# Patient Record
Sex: Female | Born: 1935 | Race: White | Hispanic: No | Marital: Single
Health system: Southern US, Community
[De-identification: ages and names within clinical notes are randomized; demographics above are authoritative.]

---

## 2009-10-08 ENCOUNTER — Emergency Department: Payer: Self-pay | Admitting: Emergency Medicine

## 2010-01-17 ENCOUNTER — Inpatient Hospital Stay: Payer: Self-pay | Admitting: Internal Medicine

## 2011-11-17 ENCOUNTER — Emergency Department: Payer: Self-pay | Admitting: Emergency Medicine

## 2011-11-17 LAB — CBC
HCT: 28.6 % — ABNORMAL LOW (ref 35.0–47.0)
HGB: 9.8 g/dL — ABNORMAL LOW (ref 12.0–16.0)
MCV: 94 fL (ref 80–100)
Platelet: 185 10*3/uL (ref 150–440)
RDW: 13.6 % (ref 11.5–14.5)
WBC: 9.7 10*3/uL (ref 3.6–11.0)

## 2011-11-17 LAB — BASIC METABOLIC PANEL
Calcium, Total: 9.5 mg/dL (ref 8.5–10.1)
Chloride: 101 mmol/L (ref 98–107)
Co2: 28 mmol/L (ref 21–32)
Creatinine: 2.37 mg/dL — ABNORMAL HIGH (ref 0.60–1.30)
EGFR (African American): 21 — ABNORMAL LOW
EGFR (Non-African Amer.): 18 — ABNORMAL LOW
Potassium: 4 mmol/L (ref 3.5–5.1)
Sodium: 137 mmol/L (ref 136–145)

## 2012-02-22 IMAGING — CR RIGHT SCAPULA - 2+ VIEWS
1 series · 2 of 2 positions shown · non-contrast
Comparison: none

REASON FOR EXAM: fell at home landed opn r side and shoulder
COMMENTS:

[Series 1: view not recorded · 0.17mm/px · 2 of 2 slices shown]
[im 1/2]
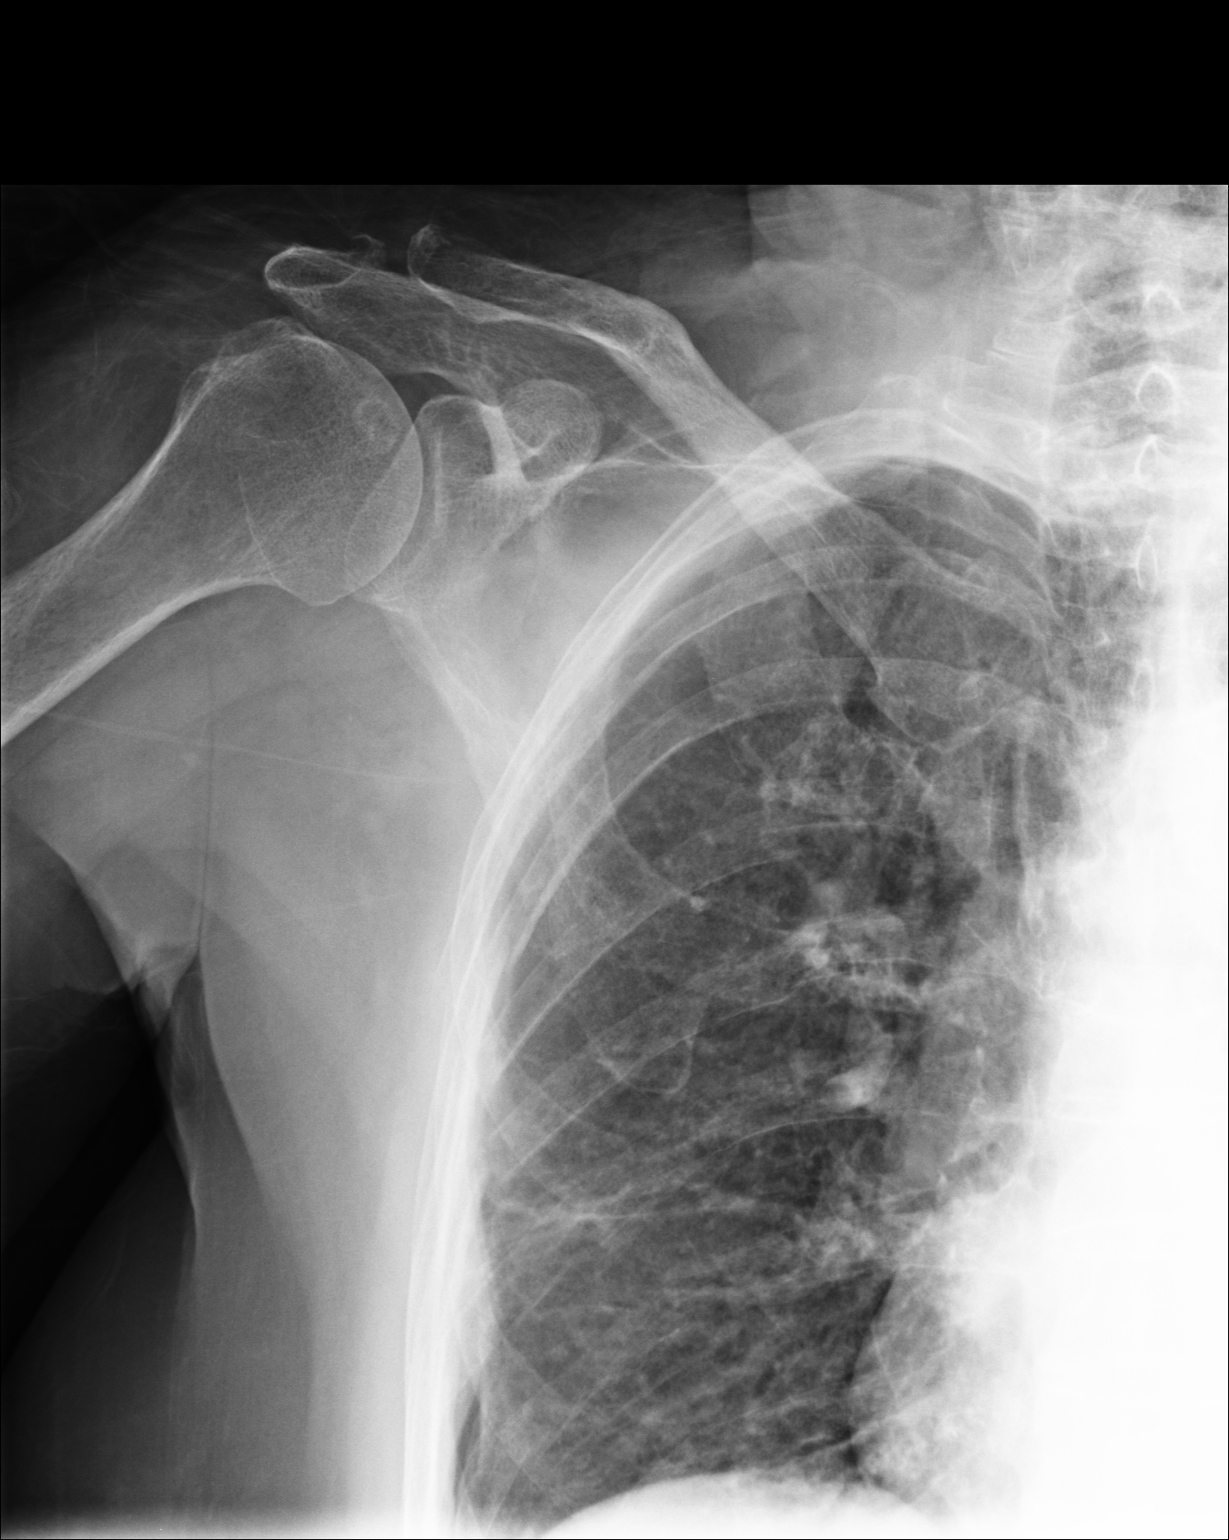
[im 2/2]
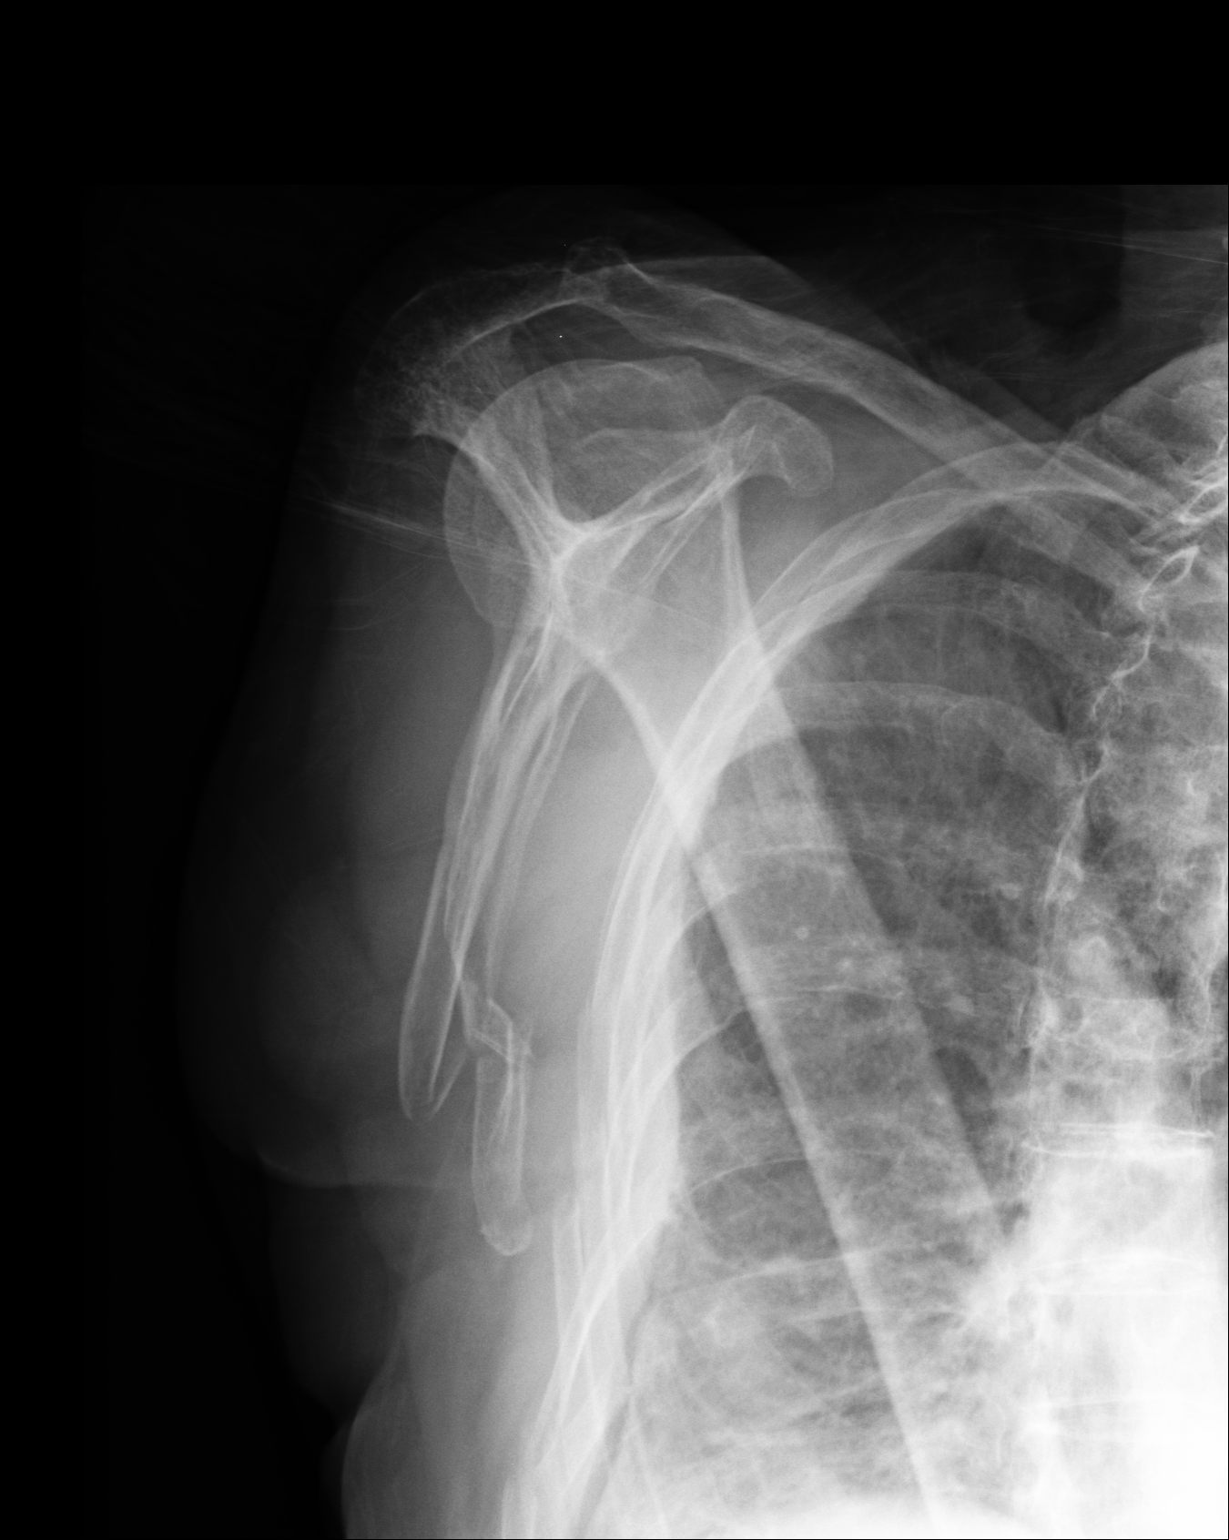

[2 of 2 positions shown; findings below may reference images not displayed]

PROCEDURE:     DXR - DXR SCAPULA RIGHT  - January 17, 2010  [DATE]

RESULT:     No fracture about the shoulder joint is seen. There is a
fracture of the inferior aspect of the scapula. The inferior component
appears mildly displaced. There are multiple right rib fractures. There is a
possible small right pneumothorax. A subpleural hematoma is present on the
right.
IMPRESSION: 1. Fracture of the scapula.
2. There are multiple right rib fracture deformities.
3. Possible small right pneumothorax for which follow-up is recommended.
4. Small subpleural hematoma is observed on the right.

## 2012-02-23 IMAGING — CR DG CHEST 1V PORT
1 series · 1 of 1 positions shown · non-contrast
Comparison: none

REASON FOR EXAM: pneumothorax
COMMENTS:

[view not recorded]
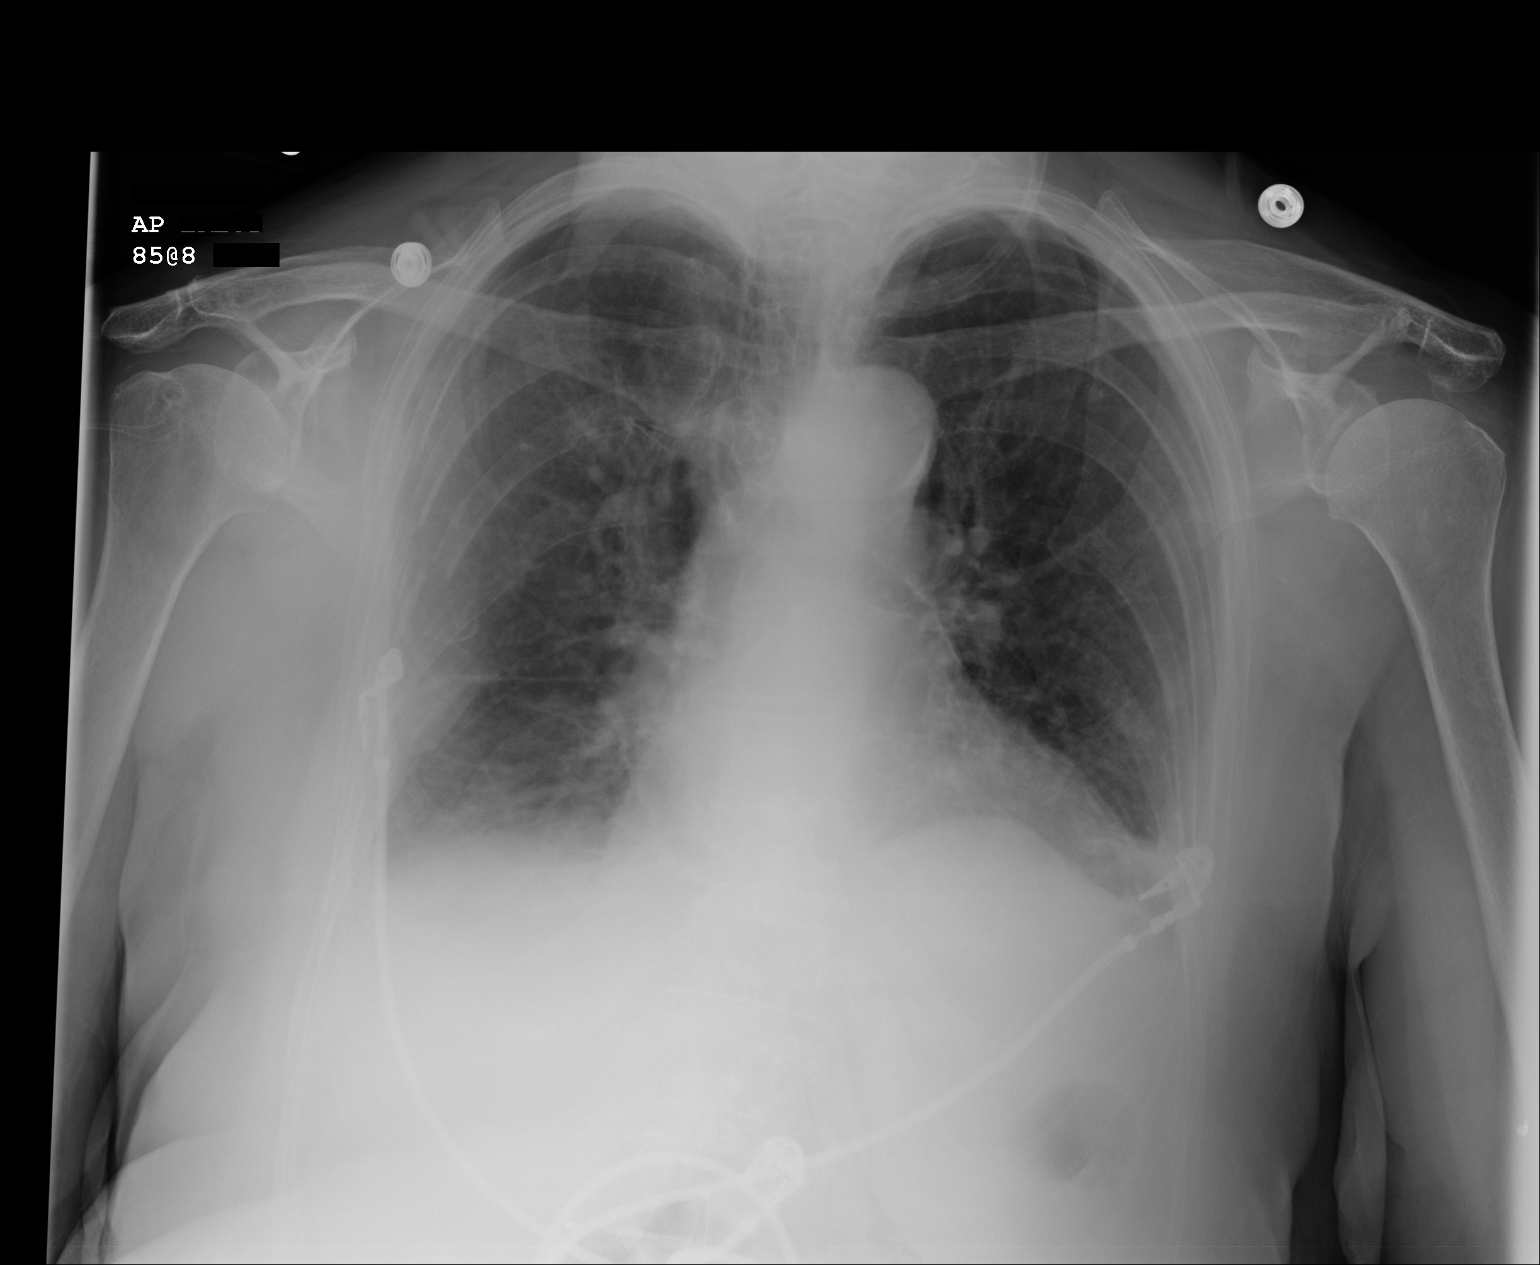

[1 of 1 positions shown; findings below may reference images not displayed]

PROCEDURE:     DXR - DXR PORTABLE CHEST SINGLE VIEW  - January 18, 2010  [DATE]

RESULT:     Comparison is made to prior study dated 01/17/2010.

Multiple rib fractures are once again identified within the right
hemithorax. There is no evidence of an appreciable pneumothorax. The patient
has taken a shallow inspiration. Areas of increased density project within
the lung bases. There is prominence of the interstitial markings. The
cardiac silhouette is moderately enlarged.
IMPRESSION: 1. Areas of mild increased density within the lung bases representing
atelectasis versus early or mild infiltrates. Otherwise, no significant
change in the chest radiograph when compared to the previous study.

## 2012-03-29 ENCOUNTER — Inpatient Hospital Stay: Payer: Self-pay | Admitting: Family Medicine

## 2012-03-29 LAB — COMPREHENSIVE METABOLIC PANEL
Albumin: 3.5 g/dL (ref 3.4–5.0)
Alkaline Phosphatase: 61 U/L (ref 50–136)
BUN: 65 mg/dL — ABNORMAL HIGH (ref 7–18)
Co2: 26 mmol/L (ref 21–32)
Creatinine: 2.06 mg/dL — ABNORMAL HIGH (ref 0.60–1.30)
EGFR (African American): 24 — ABNORMAL LOW
EGFR (Non-African Amer.): 21 — ABNORMAL LOW
Glucose: 103 mg/dL — ABNORMAL HIGH (ref 65–99)
SGOT(AST): 26 U/L (ref 15–37)
SGPT (ALT): 17 U/L (ref 12–78)
Sodium: 138 mmol/L (ref 136–145)
Total Protein: 7.6 g/dL (ref 6.4–8.2)

## 2012-03-29 LAB — TROPONIN I: Troponin-I: <0.02 ng/mL

## 2012-03-29 LAB — URINALYSIS, COMPLETE
Bilirubin,UR: NEGATIVE
Glucose,UR: NEGATIVE mg/dL (ref 0–75)
Nitrite: NEGATIVE
Protein: NEGATIVE
RBC,UR: 4 /HPF (ref 0–5)
WBC UR: 2 /HPF (ref 0–5)

## 2012-03-29 LAB — CK TOTAL AND CKMB (NOT AT ARMC): CK-MB: 1.5 ng/mL (ref 0.5–3.6)

## 2012-03-29 LAB — CBC
MCH: 30.5 pg (ref 26.0–34.0)
MCV: 95 fL (ref 80–100)
Platelet: 179 10*3/uL (ref 150–440)
RDW: 13.9 % (ref 11.5–14.5)

## 2012-03-30 ENCOUNTER — Ambulatory Visit: Payer: Self-pay | Admitting: Internal Medicine

## 2012-03-30 LAB — BASIC METABOLIC PANEL
BUN: 50 mg/dL — ABNORMAL HIGH (ref 7–18)
Calcium, Total: 9 mg/dL (ref 8.5–10.1)
Creatinine: 1.72 mg/dL — ABNORMAL HIGH (ref 0.60–1.30)
EGFR (Non-African Amer.): 26 — ABNORMAL LOW
Glucose: 92 mg/dL (ref 65–99)
Osmolality: 298 (ref 275–301)
Sodium: 143 mmol/L (ref 136–145)

## 2012-03-31 LAB — BASIC METABOLIC PANEL
BUN: 41 mg/dL — ABNORMAL HIGH (ref 7–18)
Chloride: 113 mmol/L — ABNORMAL HIGH (ref 98–107)
Creatinine: 1.66 mg/dL — ABNORMAL HIGH (ref 0.60–1.30)
EGFR (African American): 31 — ABNORMAL LOW
EGFR (Non-African Amer.): 27 — ABNORMAL LOW
Glucose: 111 mg/dL — ABNORMAL HIGH (ref 65–99)
Osmolality: 296 (ref 275–301)
Potassium: 4.2 mmol/L (ref 3.5–5.1)
Sodium: 143 mmol/L (ref 136–145)

## 2012-03-31 LAB — CBC WITH DIFFERENTIAL/PLATELET
Basophil #: 0 10*3/uL (ref 0.0–0.1)
HGB: 9.7 g/dL — ABNORMAL LOW (ref 12.0–16.0)
Lymphocyte #: 1 10*3/uL (ref 1.0–3.6)
MCHC: 33.6 g/dL (ref 32.0–36.0)
MCV: 94 fL (ref 80–100)
Monocyte %: 9.6 %
Neutrophil #: 4.7 10*3/uL (ref 1.4–6.5)
Neutrophil %: 73.5 %
Platelet: 204 10*3/uL (ref 150–440)
RBC: 3.07 10*6/uL — ABNORMAL LOW (ref 3.80–5.20)
WBC: 6.4 10*3/uL (ref 3.6–11.0)

## 2012-03-31 LAB — URINE CULTURE

## 2012-04-04 LAB — CULTURE, BLOOD (SINGLE)

## 2012-04-30 ENCOUNTER — Ambulatory Visit: Payer: Self-pay | Admitting: Internal Medicine

## 2012-07-11 ENCOUNTER — Ambulatory Visit: Payer: Self-pay | Admitting: Family Medicine

## 2012-07-11 LAB — URINALYSIS, COMPLETE
Bilirubin,UR: NEGATIVE
Nitrite: POSITIVE
Ph: 8 (ref 4.5–8.0)
RBC,UR: 5432 /HPF (ref 0–5)
Specific Gravity: 1.013 (ref 1.003–1.030)
Squamous Epithelial: 16
WBC UR: 3499 /HPF (ref 0–5)

## 2012-07-13 LAB — URINE CULTURE

## 2012-09-27 DEATH — deceased

## 2014-05-04 IMAGING — CR DG CHEST 1V PORT
1 series · 2 of 2 positions shown · non-contrast
Comparison: none

REASON FOR EXAM: hypotension
COMMENTS:

PROCEDURE:     DXR - DXR PORTABLE CHEST SINGLE VIEW  - March 29, 2012  [DATE]
RESULT:     Comparison: 11/17/2011, 01/17/2010

[Series 1: ap · 0.17mm/px · 2 of 2 slices shown]
[im 1/2]
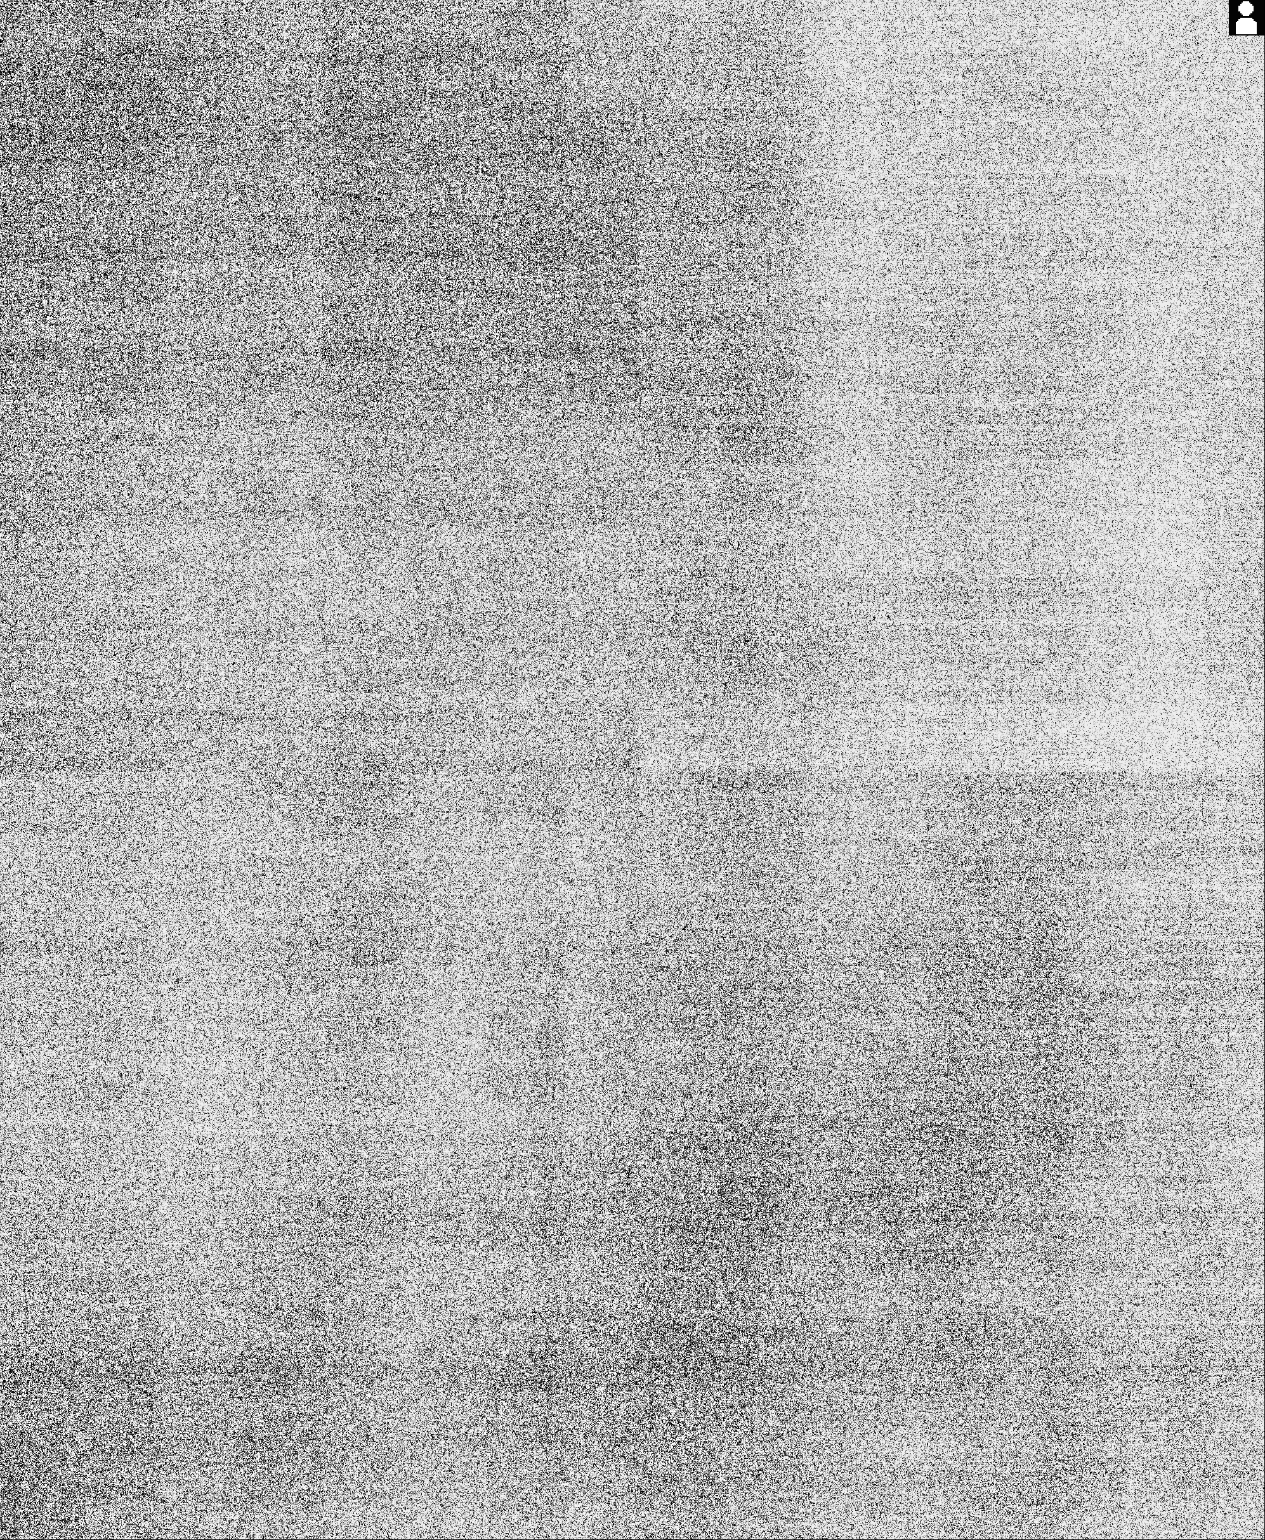
[im 2/2]
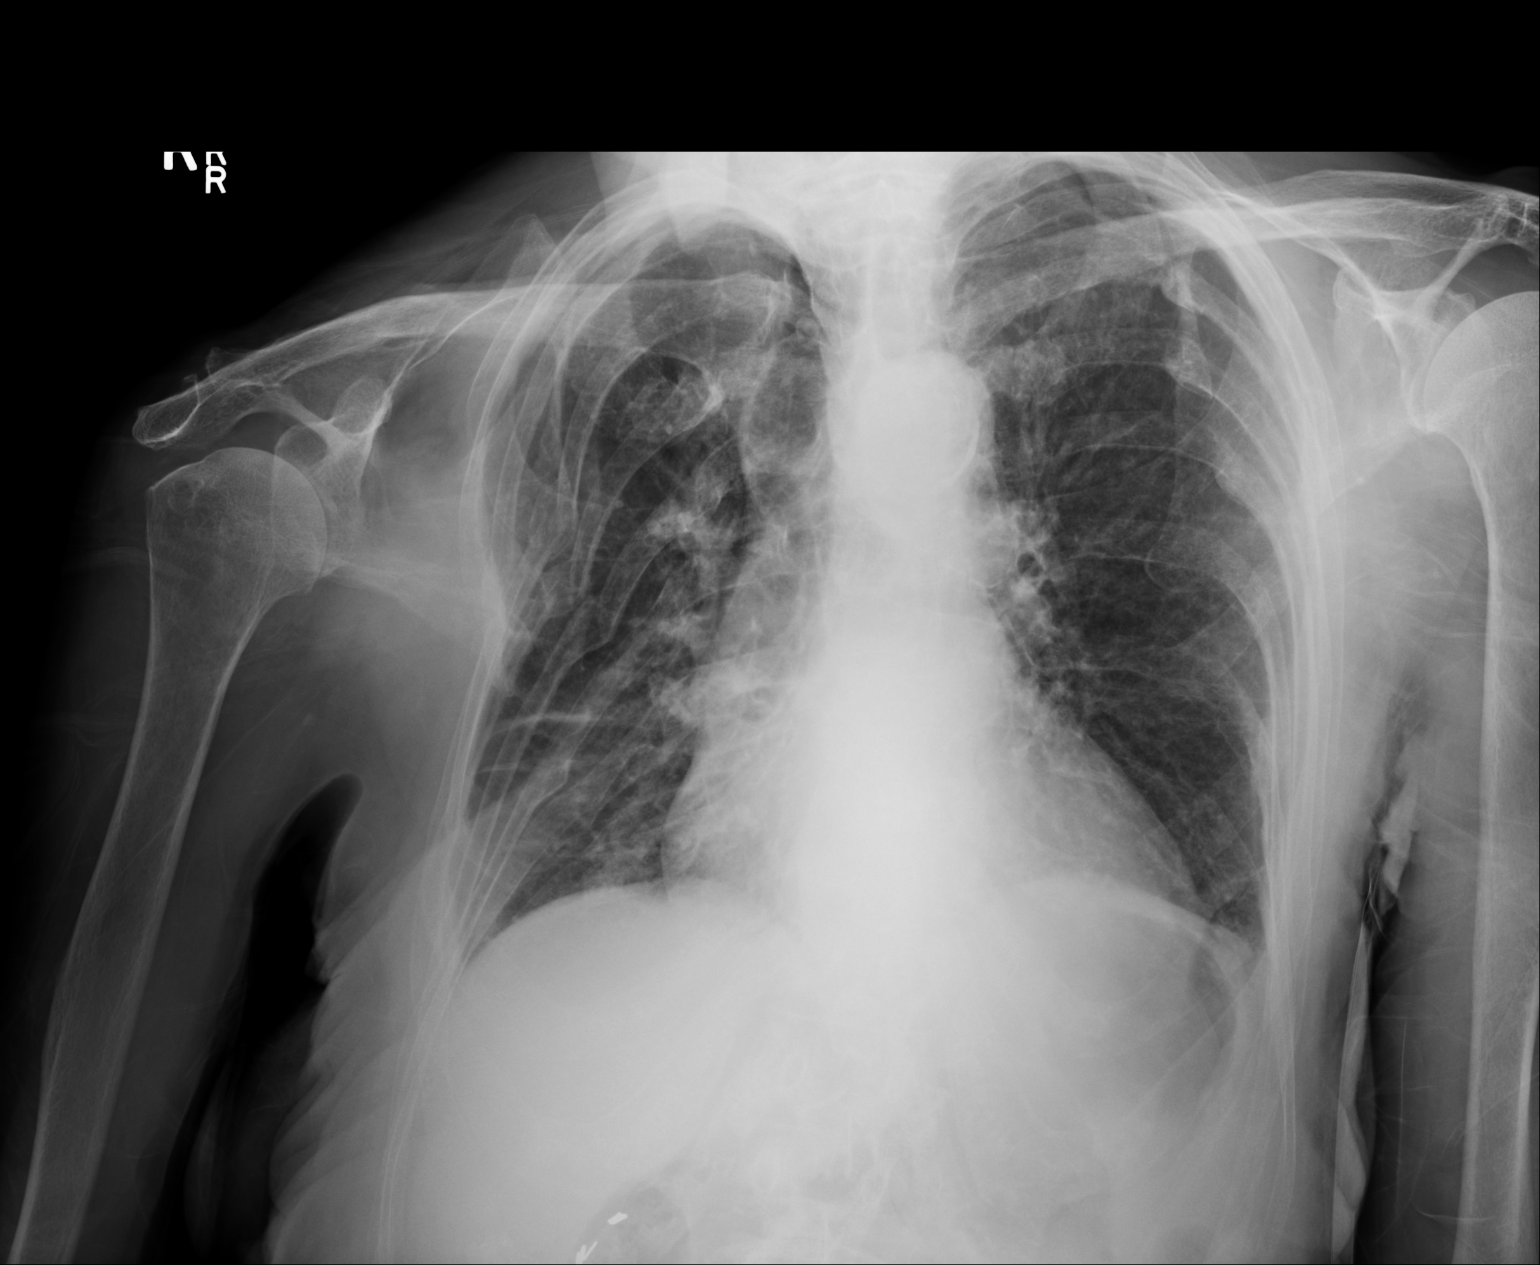

[2 of 2 positions shown; findings below may reference images not displayed]

FINDINGS: The heart and mediastinum are stable. There is marked old posttraumatic
deformity of the right chest wall, similar to prior. There are old left
posterior lateral fractures, as well. Mild right basilar opacities are
decreased from prior and likely due to atelectasis. Small nodular density
adjacent to the right hilum is similar to prior studies and likely related
to a vessel on end.
IMPRESSION: Mild right basilar opacities are decreased from prior and likely secondary
to atelectasis. However, followup PA and lateral chest radiograph is
recommended.

[REDACTED]

## 2014-07-20 NOTE — Consult Note (Signed)
    Comments   Family conference with daughter and son in-law.  Reviewed options as pt meets criteria for Hospice Services secondary to Advanced Dementia, Failure to Thrive.  Reviewed earlier discussion to include comfort care, [facility], another SNF which would accept hospice services or Hospice Home.  Discussed comfort care, rehydration and frequent hospitalizations with advanced dementia.  Daughter verbalizes understanding and wishes at this time comfort care, no re-hospitalizations.  Will proceed with hospice liasion to further discuss financial options for Hospice Home which would be daughter's first choice for discharge for pt. diagnosis: Dementia (fast 7c); FTT (PPS 40%, BMI 21.9, not eating), CKD IV  Electronic Signatures for Addendum Section:  Phifer, Harriett SineNancy (MD) (Signed Addendum 02-Jan-14 14:04)  Discussed with Myrle Wanek, NP, in detail. Agree with assessment and plan as outlined in above note.   Electronic Signatures: Pearletha FurlGusler, Tukker Byrns Z (NP)  (Signed 02-Jan-14 12:49)  Authored: Palliative Care Phifer, Harriett SineNancy (MD)  (Signed 02-Jan-14 20:30)  Authored: Palliative Care   Last Updated: 02-Jan-14 20:30 by Phifer, Harriett SineNancy (MD)

## 2014-07-20 NOTE — H&P (Signed)
PATIENT NAME:  Michelle Ho, Michelle Ho MR#:  [id number] DATE OF BIRTH:  1921/01/18  DATE OF ADMISSION:  03/29/2012  PRIMARY CARE PHYSICIAN: Dr. Quillian Quince  REFERRING PHYSICIAN: Dr. Lowella Fairy   CHIEF COMPLAINT: Altered mental status. The patient is not herself, not eating nor drinking for several days.   HISTORY OF PRESENT ILLNESS: The patient is an 81 year old Caucasian female, a nursing home resident with dementia. She was transported by EMS to the hospital for evaluation that the patient is deteriorating, and in the last several days she did not eat or drink. There is no reported fever, no vomiting, no diarrhea. The patient here is hypotensive where the initial blood pressure was 88/50. Subsequent blood pressure was better and she received IV fluids. The patient is not providing any history.   REVIEW OF SYSTEMS: A 10-point system review was unobtainable due to the patient's advanced dementia. She will mumble a few words, but she does not answer the questions directly.   PAST MEDICAL HISTORY: Advanced dementia, getting worse according to the daughter who is here in her room, systemic hypertension, hypothyroidism, hyperlipidemia, gastroesophageal reflux disease, depression, osteoarthritis, history of a fall resulting in multiple right rib fractures and right scapular fracture.   SOCIAL HISTORY: She is widowed, lives at a nursing home.   FAMILY HISTORY: Her mother died from complications of pneumonia. Her father died at the age of 98 after killing himself.   ADMISSION MEDICATIONS: Ativan 1 mg twice a day and also 3 times a day p.r.n.,  artificial eye drops, Caltrate with vitamin D once a day, Colace 100 mg twice a day, Cymbalta 20 mg at bedtime along with Remeron 7.5 mg at bedtime, Diovan 40 mg once a day, Exelon 1 patch once a day reported to be 13.3 mg, levothyroxine 125 mcg once a day, Fosamax 70 mg once a week, meloxicam 7.5 mg once a day p.r.n. for arthritis, Norco 5/325 q. 6 hours, and tramadol 50 mg 3  times a day,  Zocor 10 mg at night, Xanax 0.5 mg twice a day, Tylenol p.r.n., Systane ophthalmic drops p.r.n., Seroquel 50 mg twice a day, omeprazole 20 mg once a day.   ALLERGIES: No known drug allergies.   PAST SURGICAL HISTORY: Cholecystectomy, lumpectomy and tonsillectomy.   PHYSICAL EXAMINATION: VITAL SIGNS: Her initial blood pressure was 88/50, now it is 114/56, respiratory rate 18, pulse 79, temperature 97.9, oxygen saturation 97%.  GENERAL APPEARANCE: Elderly female lying in bed in no acute distress. She is sleeping but arousable. She will open her eyes and say a few things, but she does not comprehend the questions. She does not appear in any distress.  HEENT: Head: No pallor. No icterus. No cyanosis. Ears: Normal hearing. No ulcers. No discharge. Nose: Nasal mucosa was normal without ulcers, no discharge. Oropharyngeal area exam was limited due to the patient's noncooperation. Eyes: Eyelids are normal, normal conjunctivae.  Pupils are equal. The left pupil is reactive to light. The right, I am  not sure about reactivity to light.  NECK: Supple. Trachea at midline. No thyromegaly. No cervical lymphadenopathy. No masses.  HEART: Normal S1, S2. No S3, S4. No murmur. No gallop. No carotid bruits.  RESPIRATORY: Normal breathing pattern without use of accessory muscles, no rales, no wheezing.  ABDOMEN: Soft without tenderness. No hepatosplenomegaly. No masses. No hernias.  SKIN: No ulcers. No subcutaneous nodules.  MUSCULOSKELETAL: No joint swelling, no clubbing. There are a few ecchymotic lesions at her hand and also lower extremities, appears traumatic.  NEUROLOGIC: Cranial  nerves II through XII are intact. No focal motor deficit. The patient is usually bedridden and she does not ambulate.  PSYCHIATRIC: The patient has total dementia, does not comprehend the questions to answer. She appears calm and contented.   LABORATORY, DIAGNOSTIC AND RADIOLOGICAL DATA:  Her EKG showed normal sinus  rhythm at rate of 69 per minute, unremarkable EKG. Chest x-ray showed no acute cardiopulmonary abnormalities. There are chronic lung changes. It was difficult to visualize whether there are superimposed infiltrates or not. However, the radiologist reports that there are mild right basilar opacities, but these are decreased compared to the prior study.   Her serum glucose was 103, BUN 65, creatinine 2.06. Her baseline creatinine in August of this year was 2.3. Sodium 138, potassium 4.2, estimated GFR is 21.0. Calcium 9.8. Normal liver enzymes and transaminases. Total CPK 184. Troponin is less than 0.02. TSH was 7.45. CBC showed white count of 5000, hemoglobin 9.7, hematocrit 30, platelet count 179. Her baseline hemoglobin in August of this year was 9.8. Normal indices of MCV, MCH, and MCHC. Urinalysis was unremarkable.   ASSESSMENT: 1. Altered mental status.  2. Hypotension and dehydration. The patient stopped eating and drinking for several days. 3. Worsening dementia.  4.   Failure to thrive. 5. Systemic hypertension.  6. Chronic kidney disease, stage IV.  7. Anemia of chronic diseases.  8. History of hypertension.  9. Hypothyroidism. Her TSH is slightly elevated and may reflect inadequate intake of medication. This needs to be followed up later.  10. Multiple rib fractures status post fall.  11. Depression.  12. Osteoarthritis.   PLAN: We will admit the patient to the medical floor. IV hydration. The patient received 1 dose of IV antibiotic using Levaquin in the Emergency Department for presumed pneumonia, but right now I do not feel she has it; however, if she spikes a temperature maybe we will order a repeat a chest x-ray and re-evaluate again with the radiologist. Right now we will just observe that. Neuro checks q. 2 hours x 4, then q. 4 hours.   I spoke with the patient's daughter who has the power of attorney, and I answered her questions. She indicates that her CODE STATUS is DO NOT  RESUSCITATE. In the event that the patient will not eat or  drink despite our efforts, the daughter feels that she does not want to do anything aggressive in terms of PEG tube or artificial feeding, but this will be discussed later should the patient shows signs of no improvement.   I resumed most of her home medications, but I eliminated the duplications like Xanax and Ativan and also too many pain medications. I also will hold the Fosamax since it is once a week. This can be resumed at the time of discharge.   CODE STATUS: DO NOT RESUSCITATE.    TIME SPENT: Evaluating this patient and reviewing medical records took more than 55 minutes.   ____________________________ Carney CornersAmir M. Rudene Rearwish, MD amd:[id number] D: 03/29/2012 23:58:17 ET T: 03/30/2012 07:04:00 ET JOB#: [id number]  cc: Carney CornersAmir M. Rudene Rearwish, MD, <Dictator> Zollie ScaleAMIR M Travaris Kosh MD ELECTRONICALLY SIGNED 03/30/2012 23:01

## 2014-07-20 NOTE — Discharge Summary (Signed)
PATIENT NAME:  Michelle Ho, Michelle Ho MR#:  [id number] DATE OF BIRTH:  [birth year]  DATE OF ADMISSION:  03/29/2012 DATE OF DISCHARGE:  04/02/2012  ADMISSION DIAGNOSIS: Altered mental status with failure to thrive.   DISCHARGE DIAGNOSIS: Failure to thrive due to dementia in terminal stages and dehydration.   DISPOSITION: Hospice home.   MEDICATIONS: P.r.n. medications for pain including acetaminophen with hydrocodone, tramadol. Continue her psych medications including Zyprexa and mirtazapine.   HOSPITAL COURSE: The patient is a very nice 81 year old female who was admitted on January 1 by Dr. Marlaine HindAmir Darwish. Please refer to H and P done by Dr. Rudene Rearwish for further information. The patient came in with several day history of failure to thrive, altered mental status and being dehydrated.   Whenever I spoke to the daughter, she painted the picture a little bit better to me, referring to the fact that the patient has been pretty much quitting eating and drinking. She does not want to participate in any activities anymore. She has been retracted to herself and it seems like the deterioration has been quite rapidly over the past couple of months. She has had significant weight loss and she is getting more confused and more confused with time. Again, she has been refusing eating pretty much every single day. She got dehydrated. She went into acute kidney failure and after being monitored, she started to improve. She also has chronic kidney disease, for what her kidney failure was possibly just close to her baseline, although some IV fluids were given. We consulted palliative care. Palliative care assessed the situation and they determined that she was appropriate to go for hospice home.   The patient is discharged to hospice home in good condition today.   TIME SPENT: I spent about 35 minutes trying to get her to the hospice house. The case was discussed with the nurse and the case manager.     ____________________________ Felipa Furnaceoberto Sanchez Gutierrez, MD [email or web address] D: 04/02/2012 11:46:00 ET T: 04/02/2012 15:37:21 ET JOB#: [id number]  cc: Felipa Furnaceoberto Sanchez Gutierrez, MD, <Dictator> Charlane Westry Juanda ChanceSANCHEZ GUTIERRE MD ELECTRONICALLY SIGNED 04/08/2012 13:38
